# Patient Record
Sex: Male | Born: 2005 | Race: Black or African American | Hispanic: No | Marital: Single | Smoking: Never smoker
Health system: Southern US, Community
[De-identification: ages and names within clinical notes are randomized; demographics above are authoritative.]

## PROBLEM LIST (undated history)

## (undated) DIAGNOSIS — J45909 Unspecified asthma, uncomplicated: Secondary | ICD-10-CM

---
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
## 2024-03-22 ENCOUNTER — Emergency Department (HOSPITAL_COMMUNITY)
Admission: EM | Admit: 2024-03-22 | Discharge: 2024-03-22 | Disposition: A | Discharge status: Home / Self Care | Payer: Self-pay | Attending: Emergency Medicine | Admitting: Emergency Medicine

## 2024-03-22 ENCOUNTER — Other Ambulatory Visit: Payer: Self-pay

## 2024-03-22 ENCOUNTER — Emergency Department (HOSPITAL_COMMUNITY): Payer: Self-pay

## 2024-03-22 DIAGNOSIS — G8929 Other chronic pain: Secondary | ICD-10-CM | POA: Insufficient documentation

## 2024-03-22 DIAGNOSIS — M25512 Pain in left shoulder: Secondary | ICD-10-CM | POA: Insufficient documentation

## 2024-03-22 MED ORDER — LIDOCAINE 5 % EX PTCH: 1.0000 | MEDICATED_PATCH | CUTANEOUS | 0 refills | Status: AC

## 2024-03-22 MED ORDER — NAPROXEN 375 MG PO TABS: 375.0000 mg | ORAL_TABLET | Freq: Two times a day (BID) | ORAL | 0 refills | Status: AC

## 2024-03-22 MED ORDER — ACETAMINOPHEN 325 MG PO TABS
650.0000 mg | ORAL_TABLET | Freq: Once | ORAL | Status: AC
  Administered 2024-03-22: 650 mg via ORAL
  Filled 2024-03-22: qty 2

## 2024-03-22 MED ORDER — METHOCARBAMOL 500 MG PO TABS: 500.0000 mg | ORAL_TABLET | Freq: Two times a day (BID) | ORAL | 0 refills | Status: AC

## 2024-03-22 MED ORDER — LIDOCAINE 5 % EX PTCH
1.0000 | MEDICATED_PATCH | CUTANEOUS | Status: DC
  Administered 2024-03-22: 1 via TRANSDERMAL
  Filled 2024-03-22: qty 1

## 2024-03-22 MED ORDER — KETOROLAC TROMETHAMINE 30 MG/ML IJ SOLN
30.0000 mg | Freq: Once | INTRAMUSCULAR | Status: AC
  Administered 2024-03-22: 30 mg via INTRAMUSCULAR
  Filled 2024-03-22: qty 1
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
## 2024-03-22 NOTE — Progress Notes (Signed)
 Orthopedic Tech Progress Note Patient Details:  James Daniels 03-22-2005 [id number]  Ortho Devices Type of Ortho Device: Arm sling Ortho Device/Splint Location: LUE Ortho Device/Splint Interventions: Ordered, Application, Adjustment  ED [facility] stopped me asking for help with sizing an applying of sling   Post Interventions Patient Tolerated: Well Instructions Provided: Care of device  Delanna LITTIE Pac 03/22/2024, 1:43 PM
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
